# Patient Record
Sex: Female | Born: 1995 | Race: Black or African American | Hispanic: No | Marital: Single | Smoking: Never smoker
Health system: Southern US, Community
[De-identification: ages and names within clinical notes are randomized; demographics above are authoritative.]

---
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
## 2017-04-11 ENCOUNTER — Other Ambulatory Visit: Payer: Self-pay

## 2017-04-11 ENCOUNTER — Encounter: Payer: Self-pay | Admitting: Emergency Medicine

## 2017-04-11 ENCOUNTER — Emergency Department
Admission: EM | Admit: 2017-04-11 | Discharge: 2017-04-12 | Disposition: A | Discharge status: Home / Self Care | Payer: Managed Care, Other (non HMO) | Attending: Emergency Medicine | Admitting: Emergency Medicine

## 2017-04-11 DIAGNOSIS — Y929 Unspecified place or not applicable: Secondary | ICD-10-CM | POA: Diagnosis not present

## 2017-04-11 DIAGNOSIS — Y990 Civilian activity done for income or pay: Secondary | ICD-10-CM | POA: Insufficient documentation

## 2017-04-11 DIAGNOSIS — Y93G1 Activity, food preparation and clean up: Secondary | ICD-10-CM | POA: Insufficient documentation

## 2017-04-11 DIAGNOSIS — S61012A Laceration without foreign body of left thumb without damage to nail, initial encounter: Secondary | ICD-10-CM | POA: Diagnosis not present

## 2017-04-11 DIAGNOSIS — S6992XA Unspecified injury of left wrist, hand and finger(s), initial encounter: Secondary | ICD-10-CM | POA: Diagnosis present

## 2017-04-11 DIAGNOSIS — W260XXA Contact with knife, initial encounter: Secondary | ICD-10-CM | POA: Diagnosis not present

## 2017-04-11 MED ORDER — LIDOCAINE-EPINEPHRINE 2 %-1:100000 IJ SOLN
1.7000 mL | Freq: Once | INTRAMUSCULAR | Status: DC
  Filled 2017-04-11: qty 1.7

## 2017-04-11 MED ORDER — LIDOCAINE-EPINEPHRINE-TETRACAINE (LET) SOLUTION
3.0000 mL | Freq: Once | NASAL | Status: AC
  Administered 2017-04-11: 3 mL via TOPICAL
  Filled 2017-04-11: qty 3

## 2017-04-11 MED ORDER — LIDOCAINE-EPINEPHRINE-TETRACAINE (LET) SOLUTION
3.0000 mL | Freq: Once | NASAL | Status: DC
  Filled 2017-04-11: qty 3

## 2017-04-11 NOTE — ED Provider Notes (Signed)
[facility] Emergency Department Provider Note ____________________________________________  Time seen: 2205  I have reviewed the triage vital signs and the nursing notes.  HISTORY  Chief Complaint  Laceration  HPI Linda Brooks is a 22 y.o. female to the ED for evaluation of a laceration to her left thumb.  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  She presents now with a very small distal laceration across the fat pad. She has had a tough time getting the wound to stop bleeding. She reports a current tetanus status.   History reviewed. No pertinent past medical history.  There are no active problems to display for this patient.  History reviewed. No pertinent surgical history.  Prior to Admission medications   Not on File    Allergies Patient has no known allergies.  No family history on file.  Social History Social History   Tobacco Use  . Smoking status: Never Smoker  . Smokeless tobacco: Never Used  Substance Use Topics  . Alcohol use: Not on file  . Drug use: Not on file    Review of Systems  Constitutional: Negative for fever. Cardiovascular: Negative for chest pain. Respiratory: Negative for shortness of breath. Musculoskeletal: Negative for back pain. Skin: Negative for rash. Left thumb laceration.  Neurological: Negative for headaches, focal weakness or numbness. ____________________________________________  PHYSICAL EXAM:  VITAL SIGNS: ED Triage Vitals [04/11/17 2142]  Enc Vitals Group     BP 122/81     Pulse Rate 78     Resp 18     Temp 98.5 F (36.9 C)     Temp Source Oral     SpO2 100 %     Weight 155 lb (70.3 kg)     Height 5\' 4"  (1.626 m)     Head Circumference      Peak Flow      Pain Score      Pain Loc      Pain Edu?      Excl. in GC?     Constitutional: Alert and oriented. Well appearing and in no distress. Head: Normocephalic  and atraumatic. Cardiovascular: Normal rate, regular rhythm. Normal distal pulses. Respiratory: Normal respiratory effort.  Musculoskeletal: Normal composite fist on the left.  Patient left thumb with a superficial shave laceration to the distal fingertip through the distal nail edge.  There is no active bleeding noted at the time.  Nontender with normal range of motion in all extremities.  Neurologic:  Normal sensation. No gross focal neurologic deficits are appreciated. ____________________________________________  PROCEDURES  .Marland KitchenLaceration Repair Date/Time: 04/11/2017 10:44 PM Performed by: Andris Baumann, Student-PA Authorized by: Lissa Hoard, PA-C   Consent:    Consent obtained:  Verbal   Consent given by:  Patient   Risks discussed:  Poor wound healing Anesthesia (see MAR for exact dosages):    Anesthesia method:  Topical application   Topical anesthetic:  LET Laceration details:    Location:  Finger   Finger location:  L thumb   Length (cm):  1 Repair type:    Repair type:  Simple Exploration:    Contaminated: no   Treatment:    Area cleansed with:  Saline Skin repair:    Repair method:  Tissue adhesive Post-procedure details:    Dressing:  Non-adherent dressing   Patient tolerance of procedure:  Tolerated well, no immediate complications Comments:     Shave laceration  to the distal left thumb fingertip. Wound bed is glued after hemostasis is obtained.   ___________________________________________  INITIAL IMPRESSION / ASSESSMENT AND PLAN / ED COURSE  Patient with ED evaluation of an accidental laceration to the left thumb.  Patient's distal thumb is sliced using a knife when she was cutting lettuce.  She sustained a distal fingertip shave laceration of the fingertip.  The wound is cleansed and the wound bed is sealed using wound adhesive.  Dry sterile dressing is applied and a splint as per given for protection.  Patient will return to work with  restrictions to keep the finger clean, dry, and covered for the next week.  She will follow with primary provider as needed. ____________________________________________  FINAL CLINICAL IMPRESSION(S) / ED DIAGNOSES  Final diagnoses:  Laceration of left thumb without foreign body without damage to nail, initial encounter      Lissa HoardMenshew, Fareed Fung V Bacon, PA-C 04/11/17 2250    Phineas SemenGoodman, Gray[place], MD 04/11/17 2307

## 2017-04-11 NOTE — ED Triage Notes (Signed)
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

## 2017-04-11 NOTE — Discharge Instructions (Signed)
Your shave laceration was covered with a wound adhesive. Keep the wound clean and dry. Avoid lotions, oils, creams, and ointments over the glued wound. Wear the splint as needed. Follow-up with your provider as needed.
